# Patient Record
Sex: Male | Born: 1990 | Race: White | Hispanic: No | Marital: Single | Smoking: Current every day smoker
Health system: Southern US, Community
[De-identification: ages and names within clinical notes are randomized; demographics above are authoritative.]

## PROBLEM LIST (undated history)

## (undated) DIAGNOSIS — F419 Anxiety disorder, unspecified: Secondary | ICD-10-CM

## (undated) DIAGNOSIS — K5090 Crohn's disease, unspecified, without complications: Secondary | ICD-10-CM

## (undated) DIAGNOSIS — F4310 Post-traumatic stress disorder, unspecified: Secondary | ICD-10-CM

## (undated) DIAGNOSIS — K589 Irritable bowel syndrome without diarrhea: Secondary | ICD-10-CM

## (undated) DIAGNOSIS — J45909 Unspecified asthma, uncomplicated: Secondary | ICD-10-CM

## (undated) DIAGNOSIS — K760 Fatty (change of) liver, not elsewhere classified: Secondary | ICD-10-CM

## (undated) DIAGNOSIS — K859 Acute pancreatitis without necrosis or infection, unspecified: Secondary | ICD-10-CM

## (undated) HISTORY — PX: CARPAL TUNNEL RELEASE: SHX101

## (undated) HISTORY — PX: APPENDECTOMY: SHX54

---

## 2018-02-01 ENCOUNTER — Emergency Department (HOSPITAL_COMMUNITY): Payer: Medicaid Other

## 2018-02-01 ENCOUNTER — Other Ambulatory Visit: Payer: Self-pay

## 2018-02-01 ENCOUNTER — Emergency Department (HOSPITAL_COMMUNITY)
Admission: EM | Admit: 2018-02-01 | Discharge: 2018-02-02 | Disposition: A | Discharge status: Home / Self Care | Payer: Medicaid Other | Attending: Emergency Medicine | Admitting: Emergency Medicine

## 2018-02-01 ENCOUNTER — Encounter (HOSPITAL_COMMUNITY): Payer: Self-pay

## 2018-02-01 DIAGNOSIS — R45851 Suicidal ideations: Secondary | ICD-10-CM | POA: Insufficient documentation

## 2018-02-01 DIAGNOSIS — F17200 Nicotine dependence, unspecified, uncomplicated: Secondary | ICD-10-CM | POA: Diagnosis not present

## 2018-02-01 DIAGNOSIS — J45909 Unspecified asthma, uncomplicated: Secondary | ICD-10-CM | POA: Diagnosis not present

## 2018-02-01 DIAGNOSIS — K529 Noninfective gastroenteritis and colitis, unspecified: Secondary | ICD-10-CM | POA: Diagnosis not present

## 2018-02-01 DIAGNOSIS — Z590 Homelessness: Secondary | ICD-10-CM | POA: Diagnosis not present

## 2018-02-01 DIAGNOSIS — R1084 Generalized abdominal pain: Secondary | ICD-10-CM | POA: Diagnosis present

## 2018-02-01 DIAGNOSIS — F329 Major depressive disorder, single episode, unspecified: Secondary | ICD-10-CM | POA: Insufficient documentation

## 2018-02-01 DIAGNOSIS — R0789 Other chest pain: Secondary | ICD-10-CM | POA: Insufficient documentation

## 2018-02-01 HISTORY — DX: Irritable bowel syndrome without diarrhea: K58.9

## 2018-02-01 HISTORY — DX: Anxiety disorder, unspecified: F41.9

## 2018-02-01 HISTORY — DX: Crohn's disease, unspecified, without complications: K50.90

## 2018-02-01 HISTORY — DX: Post-traumatic stress disorder, unspecified: F43.10

## 2018-02-01 HISTORY — DX: Acute pancreatitis without necrosis or infection, unspecified: K85.90

## 2018-02-01 HISTORY — DX: Unspecified asthma, uncomplicated: J45.909

## 2018-02-01 HISTORY — DX: Fatty (change of) liver, not elsewhere classified: K76.0

## 2018-02-01 LAB — BASIC METABOLIC PANEL
Anion gap: 13 (ref 5–15)
BUN: 14 mg/dL (ref 6–20)
CO2: 21 mmol/L — ABNORMAL LOW (ref 22–32)
CREATININE: 0.94 mg/dL (ref 0.61–1.24)
Calcium: 9.9 mg/dL (ref 8.9–10.3)
Chloride: 104 mmol/L (ref 98–111)
GFR calc Af Amer: >60 mL/min (ref >60)
Glucose, Bld: 86 mg/dL (ref 70–99)
POTASSIUM: 3.7 mmol/L (ref 3.5–5.1)
SODIUM: 138 mmol/L (ref 135–145)

## 2018-02-01 LAB — URINALYSIS, ROUTINE W REFLEX MICROSCOPIC
Bilirubin Urine: NEGATIVE
Glucose, UA: NEGATIVE mg/dL
Ketones, ur: 80 mg/dL — AB
Leukocytes, UA: NEGATIVE
Nitrite: NEGATIVE
PH: 5 (ref 5.0–8.0)
Protein, ur: 30 mg/dL — AB
SPECIFIC GRAVITY, URINE: 1.025 (ref 1.005–1.030)

## 2018-02-01 LAB — CBC
HCT: 47.9 % (ref 39.0–52.0)
HEMOGLOBIN: 16.5 g/dL (ref 13.0–17.0)
MCH: 28.1 pg (ref 26.0–34.0)
MCHC: 34.4 g/dL (ref 30.0–36.0)
MCV: 81.5 fL (ref 80.0–100.0)
Platelets: 207 10*3/uL (ref 150–400)
RBC: 5.88 MIL/uL — ABNORMAL HIGH (ref 4.22–5.81)
RDW: 13.1 % (ref 11.5–15.5)
WBC: 10.5 10*3/uL (ref 4.0–10.5)
nRBC: 0 % (ref 0.0–0.2)

## 2018-02-01 LAB — LIPASE, BLOOD: LIPASE: 27 U/L (ref 11–51)

## 2018-02-01 LAB — POCT I-STAT TROPONIN I: Troponin i, poc: 0.01 ng/mL (ref 0.00–0.08)

## 2018-02-01 MED ORDER — IOPAMIDOL (ISOVUE-300) INJECTION 61%
100.0000 mL | Freq: Once | INTRAVENOUS | Status: AC | PRN
  Administered 2018-02-01: 100 mL via INTRAVENOUS

## 2018-02-01 MED ORDER — MORPHINE SULFATE (PF) 2 MG/ML IV SOLN
2.0000 mg | Freq: Once | INTRAVENOUS | Status: AC
  Administered 2018-02-01: 2 mg via INTRAVENOUS
  Filled 2018-02-01: qty 1

## 2018-02-01 MED ORDER — ONDANSETRON HCL 4 MG/2ML IJ SOLN
4.0000 mg | Freq: Once | INTRAMUSCULAR | Status: AC
  Administered 2018-02-01: 4 mg via INTRAVENOUS
  Filled 2018-02-01: qty 2

## 2018-02-01 MED ORDER — SODIUM CHLORIDE 0.9 % IV BOLUS
1000.0000 mL | Freq: Once | INTRAVENOUS | Status: AC
  Administered 2018-02-01: 1000 mL via INTRAVENOUS

## 2018-02-01 NOTE — ED Provider Notes (Addendum)
[facility]-EMERGENCY DEPT Provider Note  CSN: [id number] Arrival date & time: 02/01/18  1630  History   Chief Complaint Chief Complaint  Patient presents with  . Chest Pain  . Abdominal Pain   HPI Robert Bender is a 27 y.o. male with a medical history of Crohn's vs IBS, pancreatitis and fatty liver who presented to the ED for   Abdominal Pain   This is a new problem. Episode onset: 3 days ago. Progression since onset: Waxes and wanes. The pain is associated with an unknown factor. The pain is located in the generalized abdominal region. The quality of the pain is cramping, sharp and tearing (Reports radiating to his back and chest). The pain is at a severity of 10/10. Associated symptoms include nausea. Pertinent negatives include fever, diarrhea, hematochezia, melena, vomiting, constipation, dysuria, frequency and hematuria. Nothing aggravates the symptoms. Nothing relieves the symptoms. Past workup comments: Seen in an ED in [place], [place] yesterday. There is no supporting documentation in Epic. His past medical history is significant for Crohn's disease and irritable bowel syndrome.   Past Medical History:  Diagnosis Date  . Anxiety   . Asthma   . Crohn's disease (HCC)   . Fatty liver   . IBS (irritable bowel syndrome)   . Pancreatitis   . PTSD (post-traumatic stress disorder)     There are no active problems to display for this patient.   Past Surgical History:  Procedure Laterality Date  . APPENDECTOMY    . CARPAL TUNNEL RELEASE Bilateral         Home Medications    Prior to Admission medications   Medication Sig Start Date End Date Taking? Authorizing Provider  albuterol (PROVENTIL HFA;VENTOLIN HFA) 108 (90 Base) MCG/ACT inhaler Inhale 1-2 puffs into the lungs every 6 (six) hours as needed for wheezing or shortness of breath.   Yes [provider]  dicyclomine (BENTYL) 20 MG tablet Take 1 tablet (20 mg total) by mouth 2 (two) times  daily. 02/02/18   Mortis, Jerrel Ivory I, PA-C  ondansetron (ZOFRAN) 4 MG tablet Take 1 tablet (4 mg total) by mouth every 8 (eight) hours as needed for up to 10 days for nausea or vomiting. 02/02/18 02/12/18  Mortis, Sharyon Medicus, PA-C    Family History No family history on file.  Social History Social History   Tobacco Use  . Smoking status: Current Every Day Smoker    Packs/day: 0.50  . Smokeless tobacco: Never Used  Substance Use Topics  . Alcohol use: Never    Frequency: Never  . Drug use: Yes    Types: Marijuana     Allergies   Patient has no known allergies.   Review of Systems Review of Systems  Constitutional: Negative for appetite change, chills and fever.  Respiratory: Negative for cough, chest tightness and shortness of breath.   Cardiovascular: Positive for chest pain. Negative for palpitations.  Gastrointestinal: Positive for abdominal pain and nausea. Negative for abdominal distention, blood in stool, constipation, diarrhea, hematochezia, melena and vomiting.  Genitourinary: Positive for urgency. Negative for dysuria, frequency, hematuria and testicular pain.  Musculoskeletal: Positive for back pain. Negative for gait problem.  Skin: Negative.      Physical Exam Updated Vital Signs BP 137/74   Pulse 78   Temp (!) 97.3 F (36.3 C) (Oral)   Resp 16   Ht 5\' 11"  (1.803 m)   Wt (!) 279.4 kg   SpO2 96%   BMI 85.91 kg/m  Physical Exam  Constitutional: He is cooperative.  Obese  Cardiovascular: Normal rate, regular rhythm and normal heart sounds.  Pulses:      Radial pulses are 2+ on the right side, and 2+ on the left side.       Dorsalis pedis pulses are 2+ on the right side, and 2+ on the left side.       Posterior tibial pulses are 2+ on the right side, and 2+ on the left side.  Upper and lower distal pulses are equal and normal bilaterally.  Pulmonary/Chest: Effort normal and breath sounds normal.  Abdominal: Soft. Normal appearance and bowel  sounds are normal. He exhibits no distension. There is generalized tenderness. There is no rigidity, no rebound and no guarding.  Musculoskeletal: Normal range of motion.  Neurological: He is alert.  Skin: Skin is warm and intact. Capillary refill takes less than 2 seconds.  Nursing note and vitals reviewed.    ED Treatments / Results  Labs (all labs ordered are listed, but only abnormal results are displayed) Labs Reviewed  BASIC METABOLIC PANEL - Abnormal; Notable for the following components:      Result Value   CO2 21 (*)    All other components within normal limits  CBC - Abnormal; Notable for the following components:   RBC 5.88 (*)    All other components within normal limits  URINALYSIS, ROUTINE W REFLEX MICROSCOPIC - Abnormal; Notable for the following components:   APPearance HAZY (*)    Hgb urine dipstick SMALL (*)    Ketones, ur 80 (*)    Protein, ur 30 (*)    Bacteria, UA FEW (*)    All other components within normal limits  LIPASE, BLOOD  I-STAT TROPONIN, ED  POCT I-STAT TROPONIN I    EKG EKG Interpretation  Date/Time:  Wednesday February 01 2018 16:41:45 EDT Ventricular Rate:  112 PR Interval:    QRS Duration: 92 QT Interval:  327 QTC Calculation: 447 R Axis:   56 Text Interpretation:  Sinus tachycardia no acute ST/T changes No old tracing to compare Confirmed by Pricilla Loveless [phone]) on 02/01/2018 10:28:51 PM   Radiology Dg Chest 2 View  Result Date: 02/01/2018 CLINICAL DATA:  Chest pain. EXAM: CHEST - 2 VIEW COMPARISON:  None. FINDINGS: The heart size and mediastinal contours are within normal limits. Both lungs are clear. No pneumothorax or pleural effusion is noted. The visualized skeletal structures are unremarkable. IMPRESSION: No active cardiopulmonary disease. Electronically Signed   By: Lupita Raider, M.D.   On: 02/01/2018 17:46   Ct Abdomen Pelvis W Contrast  Result Date: 02/01/2018 CLINICAL DATA:  Abdominal pain radiating to the back  with nausea for the past 2 days. Pain is mostly left-sided. Patient fell 3 days ago. EXAM: CT ABDOMEN AND PELVIS WITH CONTRAST TECHNIQUE: Multidetector CT imaging of the abdomen and pelvis was performed using the standard protocol following bolus administration of intravenous contrast. CONTRAST:  ISOVUE-300 IOPAMIDOL (ISOVUE-300) INJECTION 61% COMPARISON:  None. FINDINGS: Lower chest: Normal size heart without pericardial effusion. Clear lung bases. No acute displaced rib fracture. Hepatobiliary: No liver laceration, mass or biliary dilatation. Unremarkable gallbladder. Pancreas: Intact without inflammation or ductal dilatation. Spleen: Intact without splenomegaly. Adrenals/Urinary Tract: No adrenal hemorrhage. Normal bilateral adrenal glands and kidneys. Symmetric pyelograms on repeat delayed imaging through the kidneys. No obstructive uropathy or mass. The urinary bladder is unremarkable for the degree of distention. Stomach/Bowel: Mild mild duodenal and proximal jejunal fluid-filled distention query small bowel  enteritis. No mechanical bowel obstruction. Post appendectomy change at the base of cecum. No acute large bowel obstruction nor inflammation. Vascular/Lymphatic: No significant vascular findings are present. No enlarged abdominal or pelvic lymph nodes. Reproductive: Prostate is unremarkable. Other: Small periumbilical fat containing hernia. No abdominopelvic ascites. Musculoskeletal: No acute or significant osseous findings. IMPRESSION: Mild distention of duodenum and proximal jejunal loops suspicious for small bowel enteritis. No mechanical bowel obstruction. No acute fracture or solid visceral organ injury. Electronically Signed   By: Tollie Eth M.D.   On: 02/01/2018 21:51    Procedures Procedures (including critical care time)  Medications Ordered in ED Medications  sodium chloride 0.9 % bolus 1,000 mL (0 mLs Intravenous Stopped 02/01/18 2244)  ondansetron (ZOFRAN) injection 4 mg (4 mg  Intravenous Given 02/01/18 1916)  morphine 2 MG/ML injection 2 mg (2 mg Intravenous Given 02/01/18 1917)  morphine 2 MG/ML injection 2 mg (2 mg Intravenous Given 02/01/18 2031)  iopamidol (ISOVUE-300) 61 % injection 100 mL (100 mLs Intravenous Contrast Given 02/01/18 2113)     Initial Impression / Assessment and Plan / ED Course  Triage vital signs and the nursing notes have been reviewed.  Pertinent labs & imaging results that were available during care of the patient were reviewed and considered in medical decision making (see chart for details).  Patient presents initially tachycardic with 3-day history of abdominal pain. Patient's clinical exam is inconsistent with the history he is giving. Initially, he is grimacing and holding his abdomen in pain and then he will be sitting up comfortably , laughing and appearing normal. On exam, there is generalized tenderness to palpation, but no rigidity, guarding or rebound tenderness. He also has associated symptoms of back and chest pain which is concerning for pancreatitis or AAA. Will order CT scan of abdomen/pelvis for further evaluation and since patient states that he did not receive this at his ED visit yesterday.  Before this provider left the room after initial exam, patient stated "Just so you know, I have suicidal tendencies. I don't want to hurt myself now and am not suicidal right this minute. But if I get discharged to the streets tonight, I'm going to try something."  Clinical Course as of Feb 03 28  Wed Feb 01, 2018  1947 Labs unremarkable. CXR normal. Normal mediastinum. EKG shows normal sinus rhythm with tachycardia. Tachycardia has since resolved with IV fluids. Negative troponin in combination with work-up so far is useful in ruling out an acute cardiac etiology to patient's complaints.   [GM]  2211 CT abdomen findings consistent with small bowel enteritis. No bowel obstruction or free fluid in the abdomen seen. No other acute  or emergent findings identified. Can be managed with supportive treatment.    [GM]  2215 Update given to patient who continues to endorse SI but has no specific plan. Suspicion is that patient is malingering as he states he recently lost housing and states that SI occurs in the context of "being out on the streets." However, will consult TTS for further evaluation and safety. From a medical perspective, patient is cleared.   [GM]  Thu Feb 02, 2018  0016 Case discussed with TTS who states that patient can be discharged as he is malingering. This was also discussed with the psych provider who is comfortable with discharge and will give patient outpatient psych resources.   [GM]    Clinical Course User Index [GM] Windy Carina, [place]     Final Clinical Impressions(s) / ED  Diagnoses  1. Enteritis. Rx for Bentyl and Zofran prescribed for symptom relief. Advised to follow-up with PCP or GI specialist if persistent. Education provided on s/s that warrant sooner medical follow-up. 2. Malingering. In hopes of secondary gain of housing. TTS evaluated patient and determined that he is safe for discharge and will receive outpatient resources.  Dispo: Home. After thorough clinical evaluation, this patient is determined to be medically stable and can be safely discharged with the previously mentioned treatment and/or outpatient follow-up/referral(s). At this time, there are no other apparent medical conditions that require further screening, evaluation or treatment.   Final diagnoses:  Enteritis    ED Discharge Orders         Ordered    dicyclomine (BENTYL) 20 MG tablet  2 times daily     02/02/18 0028    ondansetron (ZOFRAN) 4 MG tablet  Every 8 hours PRN     02/02/18 0028            Windy Carina, PA-C 02/01/18 2330    Pricilla Loveless, MD 02/02/18 0008    Dagoberto Ligas I, PA-C 02/02/18 7829    Pricilla Loveless, MD 02/03/18 1241
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
## 2018-02-01 NOTE — ED Notes (Signed)
At 1912 pt stated to provider suicidal ideation without a plan.

## 2018-02-01 NOTE — ED Triage Notes (Addendum)
Pt BIBA from shelter. Pt c/o chest painx 3 days. Pt also c/o abdominal pain that radiates to his back. Pt seen for [facility] yesterday for the same. Pt has moments of pain that quickly resolves. Pt c/o nausea and has been unable to eat or drink the last 2 days. Pt states abd pain is generalized, and that the chest pain is left sided.

## 2018-02-02 MED ORDER — ONDANSETRON HCL 4 MG PO TABS: 4.0000 mg | ORAL_TABLET | Freq: Three times a day (TID) | ORAL | 0 refills | Status: AC | PRN

## 2018-02-02 MED ORDER — DICYCLOMINE HCL 20 MG PO TABS: 20.0000 mg | ORAL_TABLET | Freq: Two times a day (BID) | ORAL | 0 refills | Status: AC
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
## 2018-02-02 NOTE — Discharge Instructions (Addendum)
Your CT showed that you have inflammation of your small intestine. I have prescribed you Bentyl (for stomach pains) and Zofran (for nausea).   The psychiatric counselor will give you outpatient resources for your mental health concerns.  Thank you for allowing [place] to take care of you today. Be safe.

## 2018-02-02 NOTE — Progress Notes (Signed)
Nurse and Dagoberto Ligas, PA informed of pt disposition.

## 2019-03-28 IMAGING — CT CT ABD-PELV W/ CM
2 of 5 series · 16 of 46 positions shown, 18 images · IV contrast (ISOVUE)
Comparison: None.

CLINICAL DATA: Abdominal pain radiating to the back with nausea for
the past 2 days. Pain is mostly left-sided. Patient fell 3 days ago.

EXAM:
CT ABDOMEN AND PELVIS WITH CONTRAST
TECHNIQUE: Multidetector CT imaging of the abdomen and pelvis was performed
using the standard protocol following bolus administration of
intravenous contrast.
CONTRAST:  100mL SNGR8T-WSS IOPAMIDOL (SNGR8T-WSS) INJECTION 61%

[Series 2: axial st · axial · 0.74mm/px · z∈[-478,-28]mm · 13 of 104 slices shown, 15 images]
[im 7/104  soft-tissue]
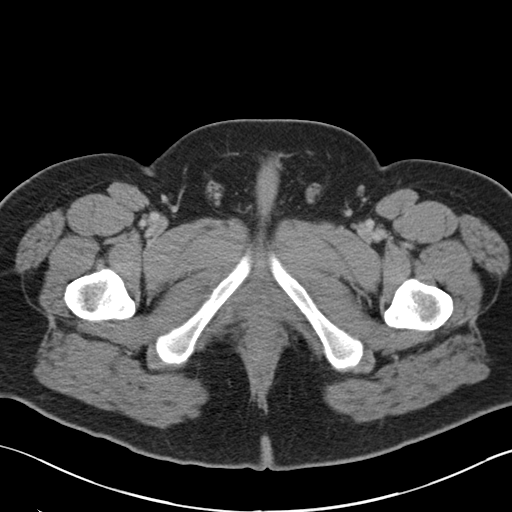
[im 7/104  bone]
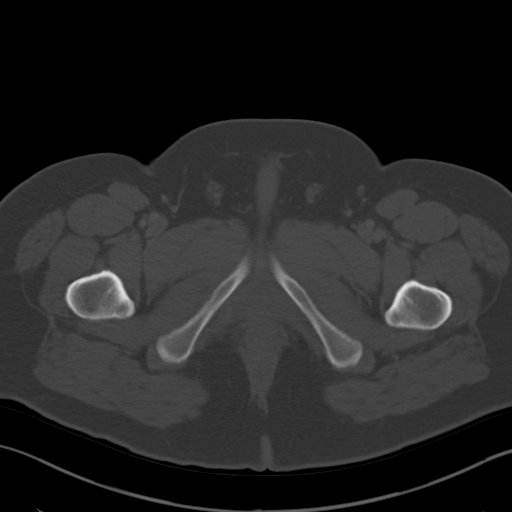
[im 13/104  soft-tissue]
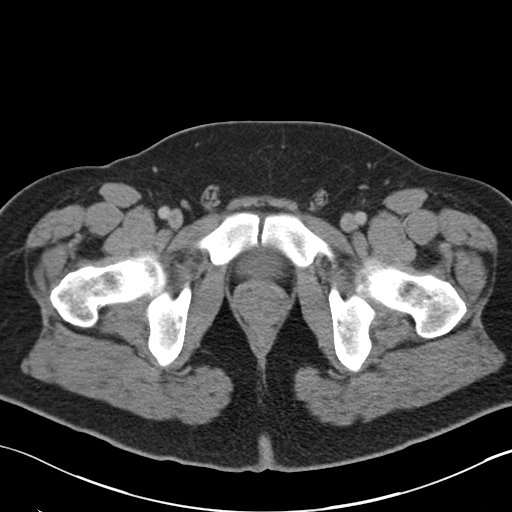
[im 20/104  soft-tissue]
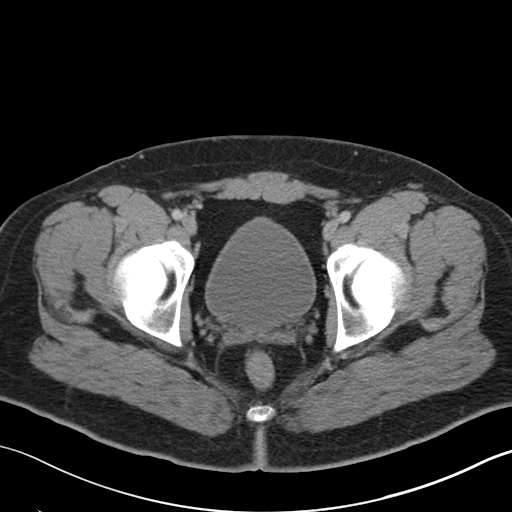
[im 33/104  soft-tissue]
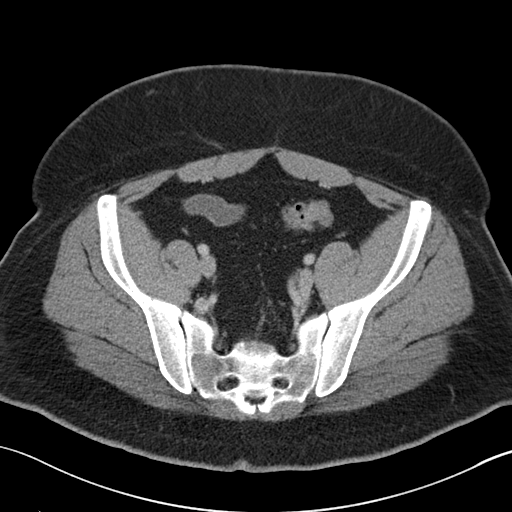
[im 39/104  soft-tissue]
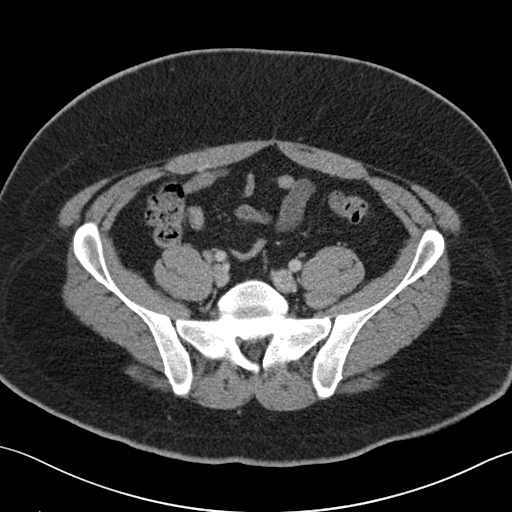
[im 46/104  soft-tissue]
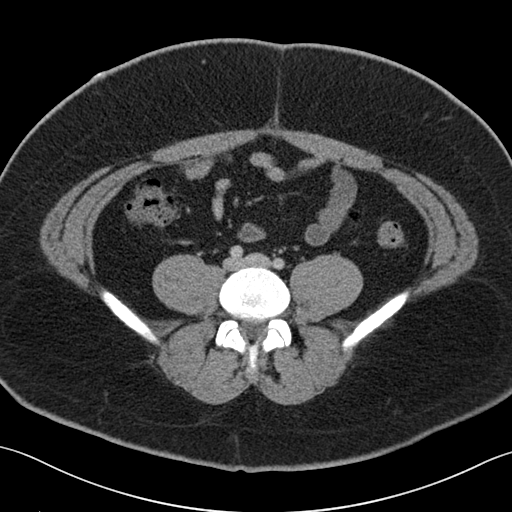
[im 52/104  soft-tissue]
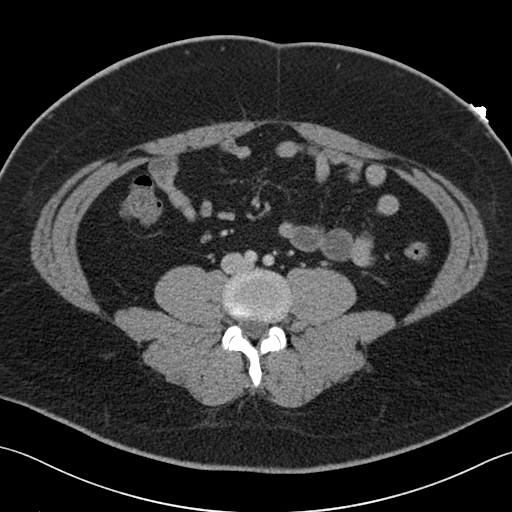
[im 58/104  soft-tissue]
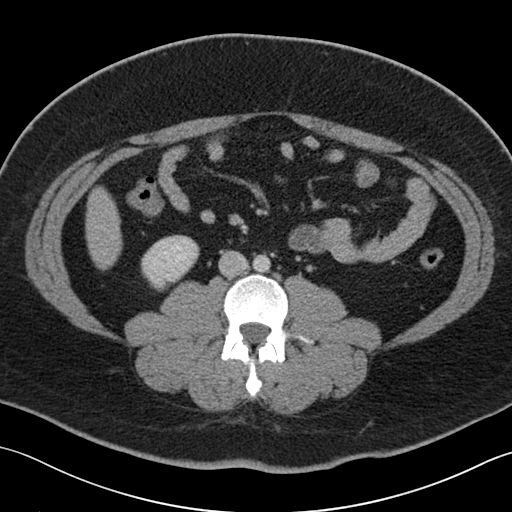
[im 65/104  soft-tissue]
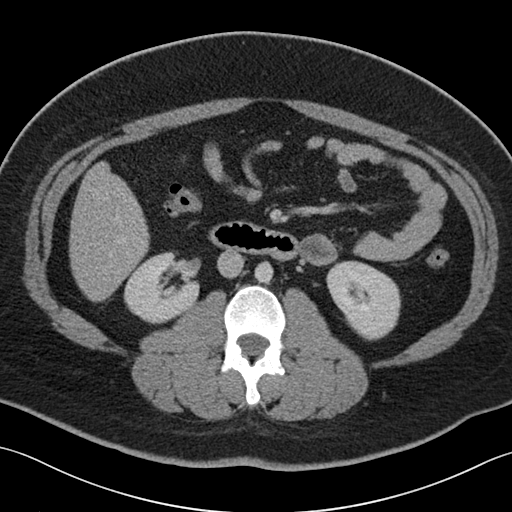
[im 65/104  bone]
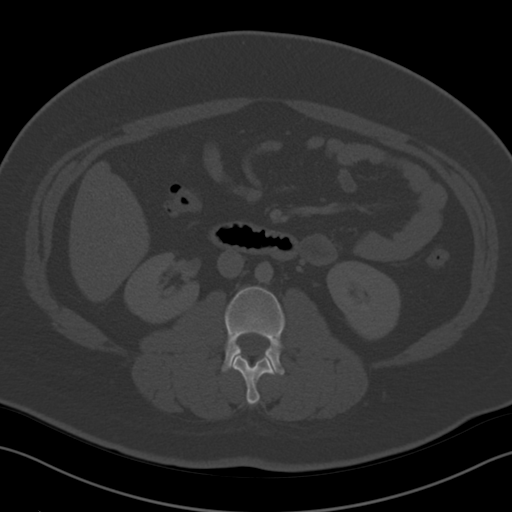
[im 71/104  soft-tissue]
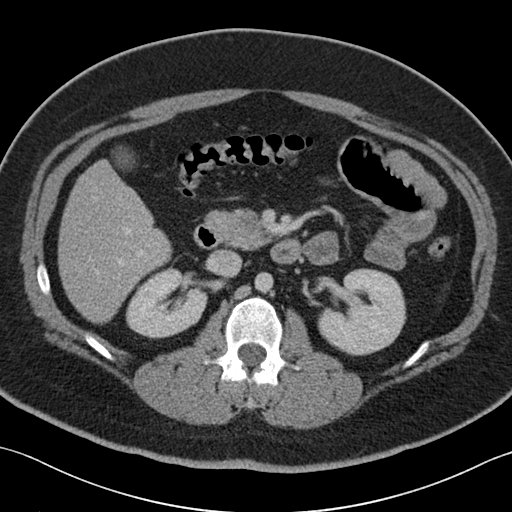
[im 84/104  soft-tissue]
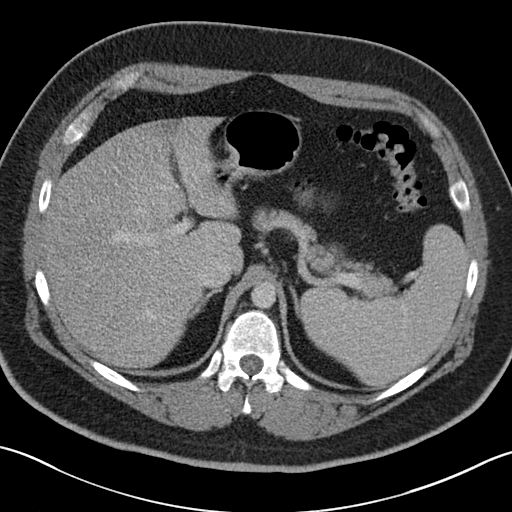
[im 91/104  soft-tissue]
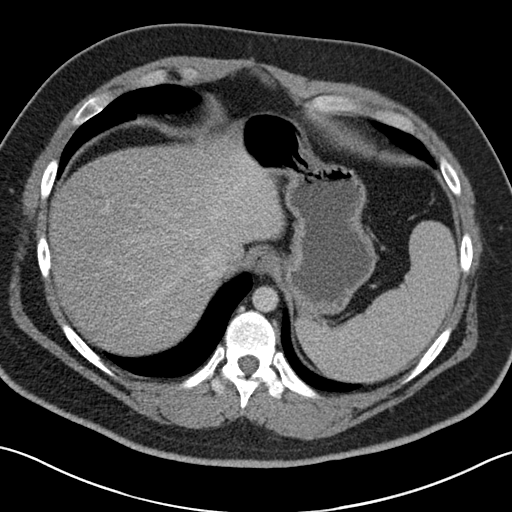
[im 97/104  soft-tissue]
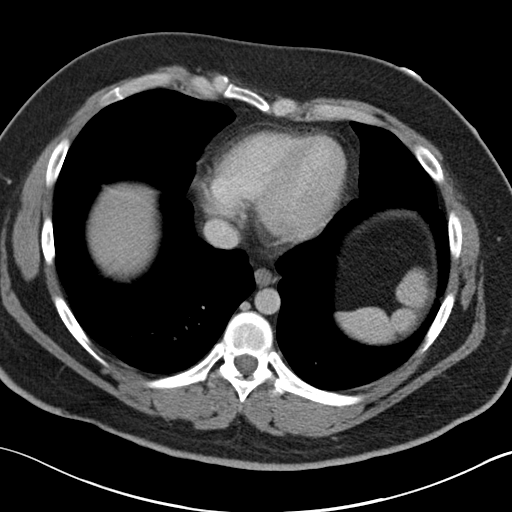

[Series 4: coronal st · coronal · 0.84mm/px · 3 of 101 slices shown]
[im 34/101  soft-tissue]
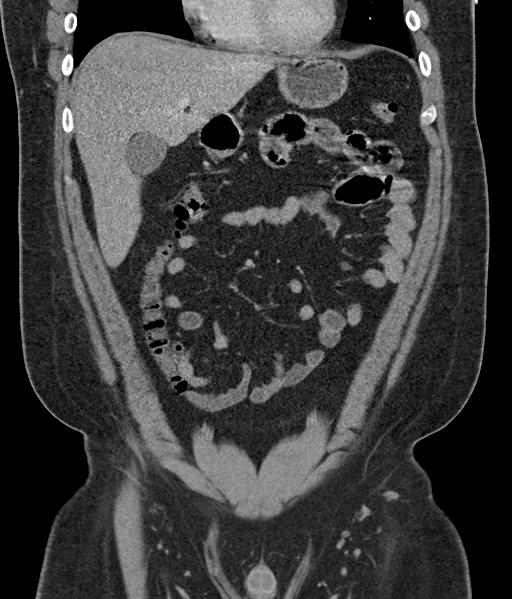
[im 45/101  soft-tissue]
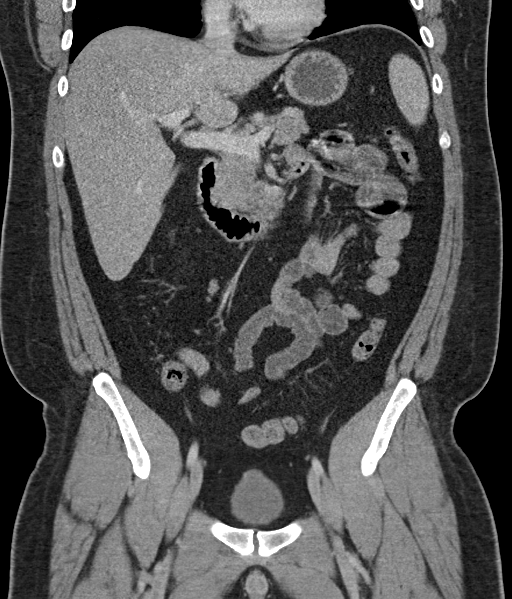
[im 56/101  soft-tissue]
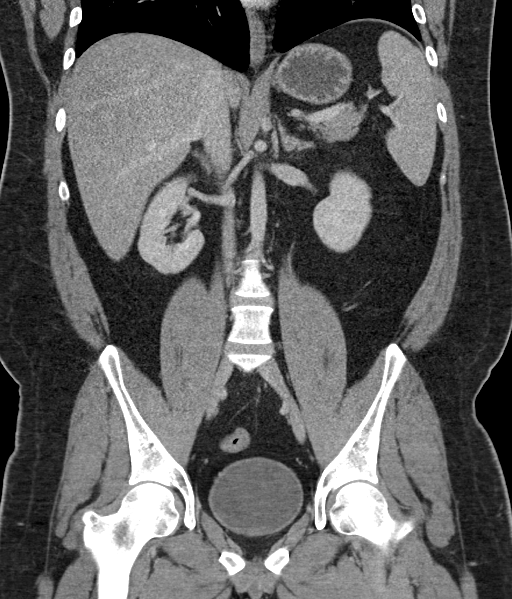

[16 of 46 positions shown; findings below may reference images not displayed]

FINDINGS: Lower chest: Normal size heart without pericardial effusion. Clear
lung bases. No acute displaced rib fracture.

Hepatobiliary: No liver laceration, mass or biliary dilatation.
Unremarkable gallbladder.

Pancreas: Intact without inflammation or ductal dilatation.

Spleen: Intact without splenomegaly.

Adrenals/Urinary Tract: No adrenal hemorrhage. Normal bilateral
adrenal glands and kidneys. Symmetric pyelograms on repeat delayed
imaging through the kidneys. No obstructive uropathy or mass. The
urinary bladder is unremarkable for the degree of distention.

Stomach/Bowel: Mild mild duodenal and proximal jejunal fluid-filled
distention query small bowel enteritis. No mechanical bowel
obstruction. Post appendectomy change at the base of cecum. No acute
large bowel obstruction nor inflammation.

Vascular/Lymphatic: No significant vascular findings are present. No
enlarged abdominal or pelvic lymph nodes.

Reproductive: Prostate is unremarkable.

Other: Small periumbilical fat containing hernia. No abdominopelvic
ascites.

Musculoskeletal: No acute or significant osseous findings.
IMPRESSION: Mild distention of duodenum and proximal jejunal loops suspicious
for small bowel enteritis. No mechanical bowel obstruction. No acute
fracture or solid visceral organ injury.

## 2019-03-28 IMAGING — CR DG CHEST 2V
2 series · 2 of 2 positions shown · non-contrast
Comparison: None.

CLINICAL DATA: Chest pain.

EXAM:
CHEST - 2 VIEW

[w chest pa]
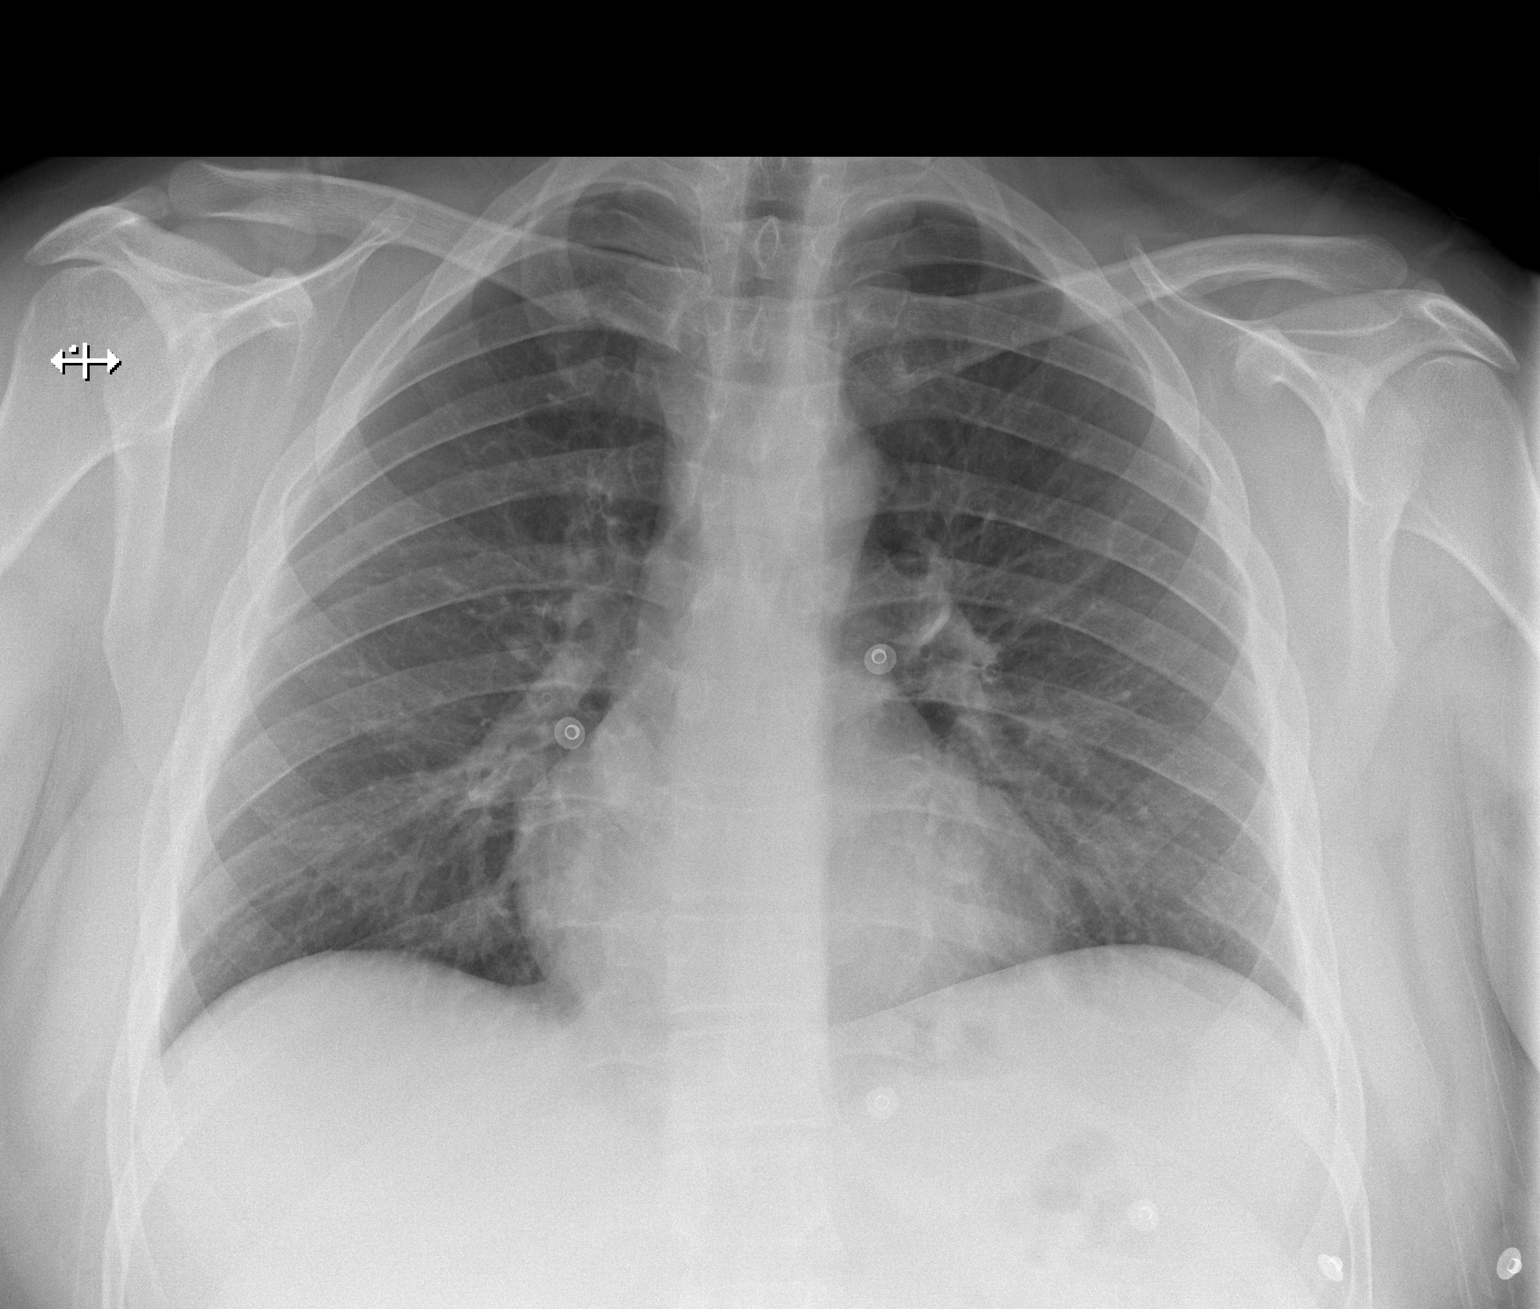

[w chest lat]
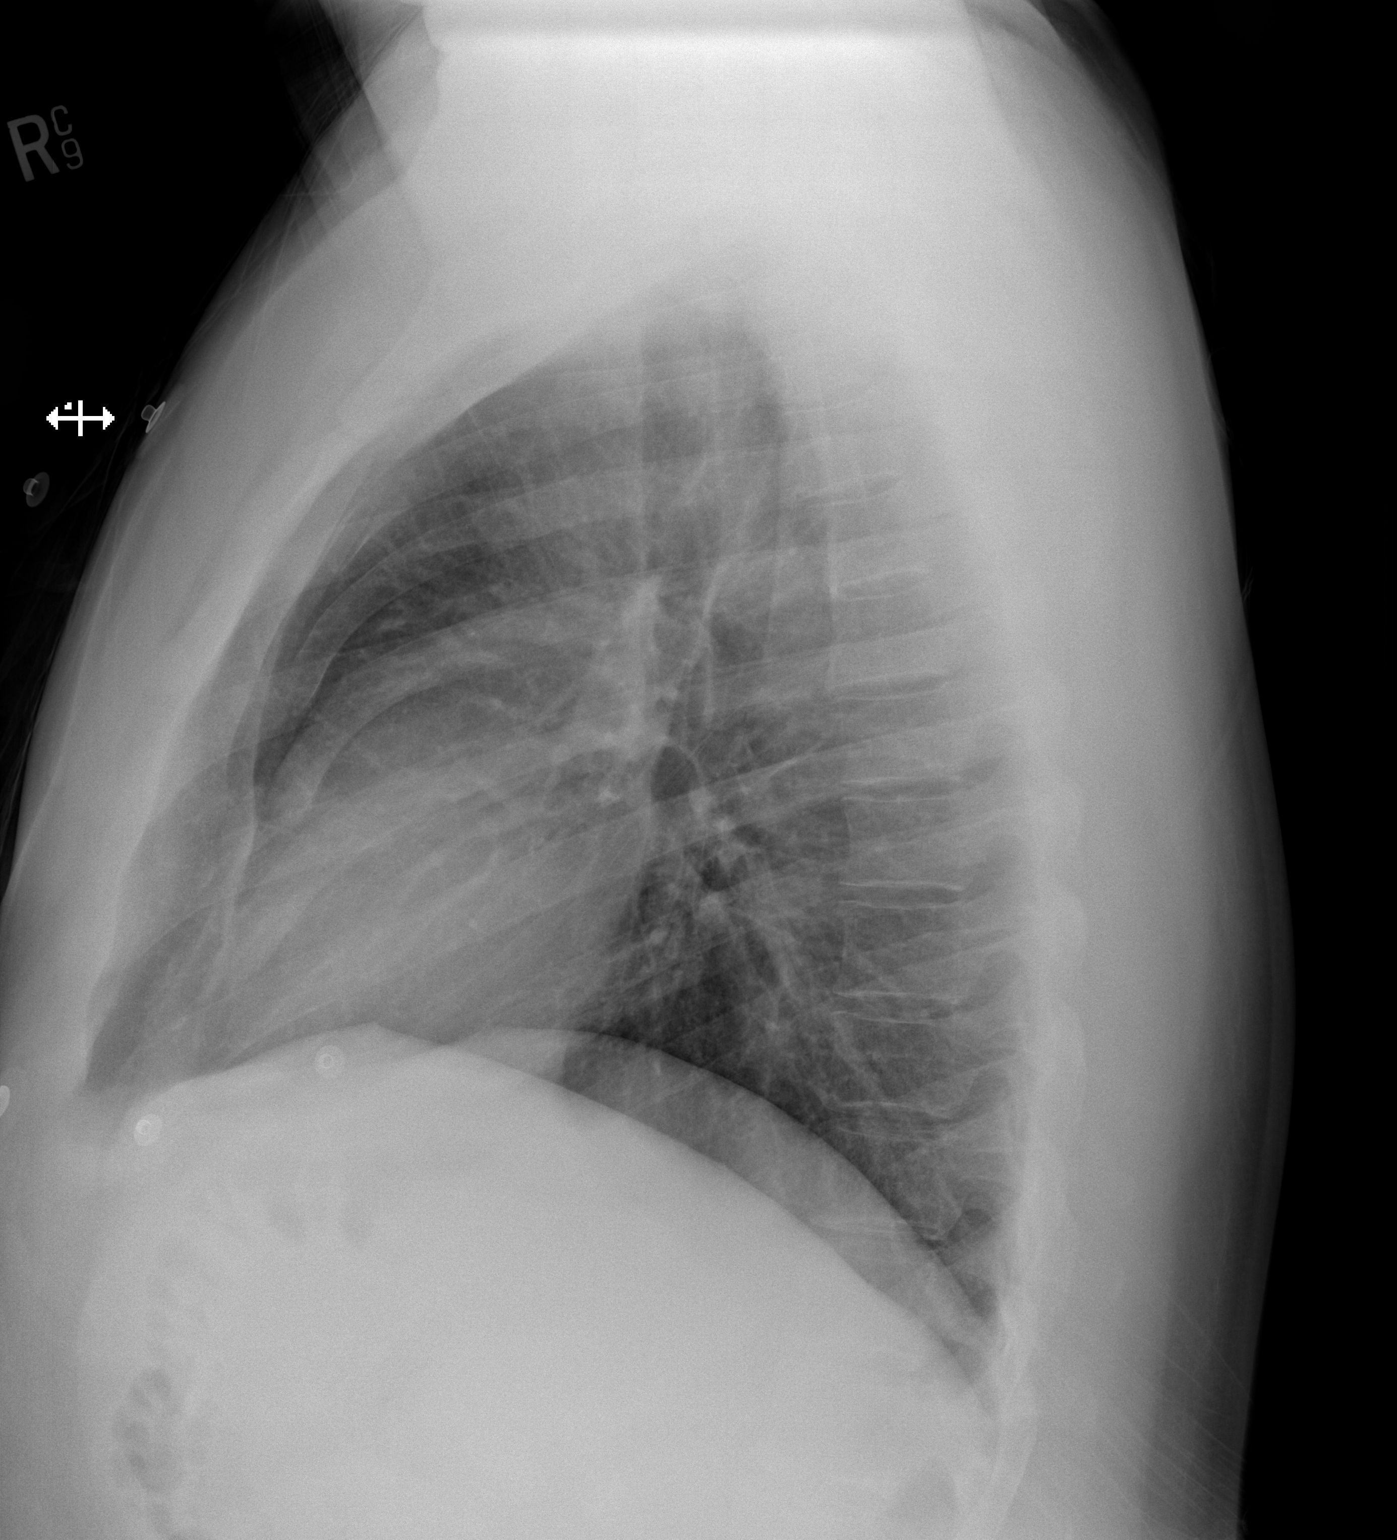

[2 of 2 positions shown; findings below may reference images not displayed]

FINDINGS: The heart size and mediastinal contours are within normal limits.
Both lungs are clear. No pneumothorax or pleural effusion is noted.
The visualized skeletal structures are unremarkable.
IMPRESSION: No active cardiopulmonary disease.
